# Patient Record
Sex: Female | Born: 1950 | Race: White | Hispanic: No | Marital: Single | Smoking: Current every day smoker
Health system: Southern US, Community
[De-identification: ages and names within clinical notes are randomized; demographics above are authoritative.]

## PROBLEM LIST (undated history)

## (undated) DIAGNOSIS — M5136 Other intervertebral disc degeneration, lumbar region: Secondary | ICD-10-CM

## (undated) DIAGNOSIS — M1990 Unspecified osteoarthritis, unspecified site: Secondary | ICD-10-CM

## (undated) DIAGNOSIS — O2230 Deep phlebothrombosis in pregnancy, unspecified trimester: Secondary | ICD-10-CM

## (undated) DIAGNOSIS — F319 Bipolar disorder, unspecified: Secondary | ICD-10-CM

## (undated) DIAGNOSIS — M51369 Other intervertebral disc degeneration, lumbar region without mention of lumbar back pain or lower extremity pain: Secondary | ICD-10-CM

## (undated) DIAGNOSIS — I2699 Other pulmonary embolism without acute cor pulmonale: Secondary | ICD-10-CM

## (undated) DIAGNOSIS — E119 Type 2 diabetes mellitus without complications: Secondary | ICD-10-CM

## (undated) HISTORY — PX: CERVICAL DISCECTOMY: SHX98

## (undated) HISTORY — PX: JOINT REPLACEMENT: SHX530

---

## 2018-01-24 ENCOUNTER — Emergency Department (HOSPITAL_COMMUNITY): Payer: Medicare (Managed Care)

## 2018-01-24 ENCOUNTER — Emergency Department (HOSPITAL_COMMUNITY)
Admission: EM | Admit: 2018-01-24 | Discharge: 2018-01-24 | Disposition: A | Discharge status: Home / Self Care | Payer: Medicare (Managed Care) | Attending: Physician Assistant | Admitting: Physician Assistant

## 2018-01-24 ENCOUNTER — Encounter (HOSPITAL_COMMUNITY): Payer: Self-pay | Admitting: Emergency Medicine

## 2018-01-24 ENCOUNTER — Other Ambulatory Visit: Payer: Self-pay

## 2018-01-24 DIAGNOSIS — R0902 Hypoxemia: Secondary | ICD-10-CM | POA: Insufficient documentation

## 2018-01-24 DIAGNOSIS — E119 Type 2 diabetes mellitus without complications: Secondary | ICD-10-CM | POA: Insufficient documentation

## 2018-01-24 DIAGNOSIS — Y999 Unspecified external cause status: Secondary | ICD-10-CM | POA: Insufficient documentation

## 2018-01-24 DIAGNOSIS — S73015A Posterior dislocation of left hip, initial encounter: Secondary | ICD-10-CM | POA: Insufficient documentation

## 2018-01-24 DIAGNOSIS — Y9301 Activity, walking, marching and hiking: Secondary | ICD-10-CM | POA: Insufficient documentation

## 2018-01-24 DIAGNOSIS — R52 Pain, unspecified: Secondary | ICD-10-CM

## 2018-01-24 DIAGNOSIS — W010XXA Fall on same level from slipping, tripping and stumbling without subsequent striking against object, initial encounter: Secondary | ICD-10-CM | POA: Insufficient documentation

## 2018-01-24 DIAGNOSIS — Z9889 Other specified postprocedural states: Secondary | ICD-10-CM

## 2018-01-24 DIAGNOSIS — Y9289 Other specified places as the place of occurrence of the external cause: Secondary | ICD-10-CM | POA: Insufficient documentation

## 2018-01-24 DIAGNOSIS — Z885 Allergy status to narcotic agent status: Secondary | ICD-10-CM | POA: Insufficient documentation

## 2018-01-24 DIAGNOSIS — Z96642 Presence of left artificial hip joint: Secondary | ICD-10-CM | POA: Insufficient documentation

## 2018-01-24 DIAGNOSIS — Z880 Allergy status to penicillin: Secondary | ICD-10-CM | POA: Insufficient documentation

## 2018-01-24 DIAGNOSIS — F17200 Nicotine dependence, unspecified, uncomplicated: Secondary | ICD-10-CM | POA: Insufficient documentation

## 2018-01-24 DIAGNOSIS — IMO0001 Reserved for inherently not codable concepts without codable children: Secondary | ICD-10-CM

## 2018-01-24 DIAGNOSIS — Z86711 Personal history of pulmonary embolism: Secondary | ICD-10-CM | POA: Insufficient documentation

## 2018-01-24 HISTORY — DX: Other intervertebral disc degeneration, lumbar region: M51.36

## 2018-01-24 HISTORY — DX: Other intervertebral disc degeneration, lumbar region without mention of lumbar back pain or lower extremity pain: M51.369

## 2018-01-24 HISTORY — DX: Deep phlebothrombosis in pregnancy, unspecified trimester: O22.30

## 2018-01-24 HISTORY — DX: Bipolar disorder, unspecified: F31.9

## 2018-01-24 HISTORY — DX: Other pulmonary embolism without acute cor pulmonale: I26.99

## 2018-01-24 HISTORY — DX: Type 2 diabetes mellitus without complications: E11.9

## 2018-01-24 HISTORY — DX: Unspecified osteoarthritis, unspecified site: M19.90

## 2018-01-24 MED ORDER — PROPOFOL 10 MG/ML IV BOLUS
1.0000 mg/kg | Freq: Once | INTRAVENOUS | Status: DC
  Filled 2018-01-24: qty 20

## 2018-01-24 MED ORDER — SODIUM CHLORIDE 0.9 % IV BOLUS (SEPSIS)
500.0000 mL | Freq: Once | INTRAVENOUS | Status: AC
  Administered 2018-01-24: 500 mL via INTRAVENOUS

## 2018-01-24 MED ORDER — HYDROMORPHONE HCL 1 MG/ML IJ SOLN
2.0000 mg | Freq: Once | INTRAMUSCULAR | Status: AC
  Administered 2018-01-24: 2 mg via INTRAVENOUS
  Filled 2018-01-24: qty 2

## 2018-01-24 MED ORDER — PROPOFOL 10 MG/ML IV BOLUS
INTRAVENOUS | Status: AC | PRN
  Administered 2018-01-24: 5 mg via INTRAVENOUS

## 2018-01-24 MED ORDER — KETAMINE HCL 10 MG/ML IJ SOLN
INTRAMUSCULAR | Status: AC | PRN
  Administered 2018-01-24: 7 mg via INTRAVENOUS

## 2018-01-24 MED ORDER — KETAMINE HCL-SODIUM CHLORIDE 100-0.9 MG/10ML-% IV SOSY
1.0000 mg/kg | PREFILLED_SYRINGE | Freq: Once | INTRAVENOUS | Status: DC
  Filled 2018-01-24: qty 10

## 2018-01-24 NOTE — ED Notes (Signed)
Pt ambulated to bathroom and back to room well. Pt stated that she has no pain at all.

## 2018-01-24 NOTE — Discharge Planning (Signed)
Erika Cohnamellia [name], RN, BSN, [place] [phone] Pt qualifies for DME rolling walker.  DME  ordered through [facility].  [facility]er to pt room prior to D/C home.

## 2018-01-24 NOTE — ED Provider Notes (Signed)
[facility] EMERGENCY DEPARTMENT Provider Note   CSN: [id number] Arrival date & time: 01/24/18  0800     History   Chief Complaint Chief Complaint  Patient presents with  . Fall  . Hip Pain    HPI Erika Anthony is a 67 y.o. female who presents with left hip pain.  Past medical history significant for arthritis, degenerative disc disease, chronic low back pain.  Past surgical history significant for left total hip replacement and left total knee replacement.  She states she is from [place] and has been visiting a friend for the past 3 weeks.  Today she was at the airport to go back home when she tripped and fell onto the right side.  She states she has had falls in the past however usually can get up on her own.  She was not able to get up and therefore EMS was called and she was transported to the ED.  She denies worsening of her chronic low back pain or any knee pain. She has had incidents in the past were she has felt her hip dislocate but it has always gone back in to place.  HPI  Past Medical History:  Diagnosis Date  . Arthritis    Osteoarthritis  . Bipolar 1 disorder (HCC)   . Degenerative disc disease, lumbar   . Diabetes mellitus without complication (HCC)   . DVT (deep vein thrombosis) in pregnancy (HCC)   . PE (pulmonary thromboembolism) (HCC)     There are no active problems to display for this patient.   Past Surgical History:  Procedure Laterality Date  . CERVICAL DISCECTOMY    . JOINT REPLACEMENT     L Hip - December 2018    OB History    No data available       Home Medications    Prior to Admission medications   Not on File    Family History No family history on file.  Social History Social History   Tobacco Use  . Smoking status: Current Every Day Smoker    Packs/day: 0.50  . Smokeless tobacco: Never Used  Substance Use Topics  . Alcohol use: No    Frequency: Never  . Drug use: No     Allergies   Morphine  and related and Penicillins   Review of Systems Review of Systems  Respiratory: Negative for shortness of breath.   Cardiovascular: Negative for chest pain.  Musculoskeletal: Positive for arthralgias, gait problem and myalgias. Negative for back pain.  Skin: Positive for wound.  Neurological: Negative for weakness and numbness.     Physical Exam Updated Vital Signs BP 138/81 (BP Location: Right Arm)   Pulse 89   Temp 98.4 F (36.9 C)   Resp 16   Ht 5\' 9"  (1.753 m)   Wt 106.6 kg (235 lb)   SpO2 99%   BMI 34.70 kg/m   Physical Exam  Constitutional: She is oriented to person, place, and time. She appears well-developed and well-nourished. No distress.  Pleasant. Lying on her right side  HENT:  Head: Normocephalic and atraumatic.  Eyes: Conjunctivae are normal. Pupils are equal, round, and reactive to light. Right eye exhibits no discharge. Left eye exhibits no discharge. No scleral icterus.  Neck: Normal range of motion.  Cardiovascular: Normal rate.  Pulmonary/Chest: Effort normal. No respiratory distress.  Abdominal: She exhibits no distension.  Musculoskeletal:  Mild lumbar tenderness  Left hip: Prior surgical scar. Tenderness over lateral and anterior hip.  Left knee: No obvious swelling, deformity, or warmth. Prior surgical scar. Small skin tear. No tenderness. N/V intact.   Neurological: She is alert and oriented to person, place, and time.  Skin: Skin is warm and dry.  Psychiatric: She has a normal mood and affect. Her behavior is normal.  Nursing note and vitals reviewed.    ED Treatments / Results  Labs (all labs ordered are listed, but only abnormal results are displayed) Labs Reviewed - No data to display  EKG  EKG Interpretation None       Radiology Dg Lumbar Spine 2-3 Views  Result Date: 01/24/2018 CLINICAL DATA:  Low back pain after fall today. EXAM: LUMBAR SPINE - 2-3 VIEW COMPARISON:  None. FINDINGS: No fracture or spondylolisthesis is  noted. Atherosclerosis of thoracic aorta is noted. Moderate degenerative disc disease is noted at L1-2, L2-3, L3-4 and L4-5. IVC filter is noted. IMPRESSION: Multilevel degenerative disc disease. No acute abnormality seen in the lumbar spine. Electronically Signed   By: Lupita Raider, M.D.   On: 01/24/2018 10:51   Dg Knee Complete 4 Views Left  Result Date: 01/24/2018 CLINICAL DATA:  Left knee pain after fall today. EXAM: LEFT KNEE - COMPLETE 4+ VIEW COMPARISON:  None. FINDINGS: Status post left total knee arthroplasty. The femoral and tibial components appear to be well situated. No fracture or dislocation is noted. No soft tissue abnormality is noted. IMPRESSION: Status post left total knee arthroplasty. No acute abnormality seen the left knee. Electronically Signed   By: Lupita Raider, M.D.   On: 01/24/2018 10:56   Dg Hip Unilat With Pelvis 2-3 Views Left  Result Date: 01/24/2018 CLINICAL DATA:  Hip dislocation, postreduction EXAM: DG HIP (WITH OR WITHOUT PELVIS) 2-3V LEFT COMPARISON:  None. FINDINGS: There has been reduction of the posterosuperior dislocation of the LEFT femoral prosthesis in this patient with a LEFT total hip arthroplasty. No fractures are seen. There is no apparent loosening. IMPRESSION: Satisfactory post reduction radiographs. Electronically Signed   By: Elsie Stain M.D.   On: 01/24/2018 14:15   Dg Hip Unilat W Or Wo Pelvis 2-3 Views Left  Result Date: 01/24/2018 CLINICAL DATA:  Left hip pain after fall today. EXAM: DG HIP (WITH OR WITHOUT PELVIS) 2-3V LEFT COMPARISON:  None. FINDINGS: Status post left total hip arthroplasty. There appears to be posterior and superior dislocation of the left femoral prosthesis relative to the acetabulum. No definite fracture is noted. IMPRESSION: Dislocation of left hip prosthesis is noted. Electronically Signed   By: Lupita Raider, M.D.   On: 01/24/2018 10:53    Procedures Reduction of dislocation Date/Time: 01/24/2018 3:02  PM Performed by: Bethel Born, PA-C Authorized by: Bethel Born, PA-C  Consent: Verbal consent obtained. Written consent obtained. Risks and benefits: risks, benefits and alternatives were discussed Consent given by: patient Patient understanding: patient states understanding of the procedure being performed Patient consent: the patient's understanding of the procedure matches consent given Procedure consent: procedure consent matches procedure scheduled Relevant documents: relevant documents present and verified Test results: test results available and properly labeled Site marked: the operative site was marked Imaging studies: imaging studies available Required items: required blood products, implants, devices, and special equipment available Patient identity confirmed: verbally with patient and arm band Time out: Immediately prior to procedure a "time out" was called to verify the correct patient, procedure, equipment, support staff and site/side marked as required. Preparation: Patient was prepped and draped in the usual sterile fashion. Local  anesthesia used: no  Anesthesia: Local anesthesia used: no  Sedation: Patient sedated: yes Sedation type: moderate (conscious) sedation Sedatives: ketamine and propofol Analgesia: hydromorphone Sedation start date/time: 01/24/2018 12:30 PM Sedation end date/time: 01/24/2018 3:03 PM Vitals: Vital signs were monitored during sedation.  Patient tolerance of procedure: Patient had laryngospasm and had desaturations. She was provided O2 through bag valve mask and resumed respirations on her own.    (including critical care time)    Medications Ordered in ED Medications  HYDROmorphone (DILAUDID) injection 2 mg (2 mg Intravenous Given 01/24/18 1035)     Initial Impression / Assessment and Plan / ED Course  I have reviewed the triage vital signs and the nursing notes.  Pertinent labs & imaging results that were available  during my care of the patient were reviewed by me and considered in my medical decision making (see chart for details).  67 year old with left hip pain. Vitals are normal. Xrays and pain medicine were ordered. She is asking for 4mg  of Dilaudid. Narcotic database [place] quer[place] in [place] which shows only rx for Gabapentin for the past year. 2mg  was ordered.  11:03 AM Xrays of left hip show a posterior and superior dislocation. Xray of lumbar spine and knee are negative.   12:30 PM Patient was consciously sedated and hip was successfully reduced.  2:15 PM: Post-reduction films confirms successful reduction. She was able to ambulate with a walker. She was given post-reduction precautions and was advised to follow up with orthopedics.  Final Clinical Impressions(s) / ED Diagnoses   Final diagnoses:  Posterior dislocation of left hip, initial encounter [facility](HCC)    ED Discharge Orders    None       Bethel BornGekas, Daniela Siebers Marie, PA-C 01/24/18 1520    Mackuen, Cindee Saltourteney Lyn, MD 01/24/18 1538

## 2018-01-24 NOTE — Progress Notes (Signed)
During conscious sedation procedure pt stopped breathing. RT provided manual ventilation using ambu bag until pt recovered. Vitals recorded are post procedure.

## 2018-01-24 NOTE — ED Triage Notes (Signed)
Pt in after tripping and falling in airport. C/o L hip pain, no obvious deformity. Pain with extension of leg. Hx of L hip replacement in December. Skin tear present on L knee. Good pedal pulse

## 2018-01-24 NOTE — Discharge Instructions (Signed)
Please wear knee immobilizer at all times until you are cleared by your doctor Use walker To prevent another dislocation: Sit with your back straight and your feet flat on the floor. Do not cross your legs. Do not lean forward when you sit in a chair. Keep your knees apart. Place a pillow or wedge between your knees when you sit or lie down. Do not twist your knees. Do not lift your knees higher than your hips. Do not sit in a low chair. Use armrests and your upper body strength to push yourself up from a sitting position. Do not bend at the waist to pick up an object from the floor. Bend your knees to reach the object, or use a tool to pick it up.

## 2018-01-24 NOTE — Progress Notes (Signed)
pts vitals recorded prior to conscious sedation procedure.

## 2018-01-24 NOTE — Progress Notes (Signed)
Orthopedic Tech Progress Note Patient Details:  Erika Anthony 06/28/1951 [id number]  Ortho Devices Type of Ortho Device: Knee Immobilizer Ortho Device/Splint Location: lle Ortho Device/Splint Interventions: Application   Post Interventions Patient Tolerated: Well Instructions Provided: Care of device   Nikki DomCrawford, [name] 01/24/2018, 1:19 PM

## 2018-11-18 IMAGING — CR DG HIP (WITH OR WITHOUT PELVIS) 2-3V*L*
3 series · 3 of 3 positions shown · non-contrast
Comparison: None.

CLINICAL DATA: Left hip pain after fall today.

EXAM:
DG HIP (WITH OR WITHOUT PELVIS) 2-3V LEFT

[pelvis ap]
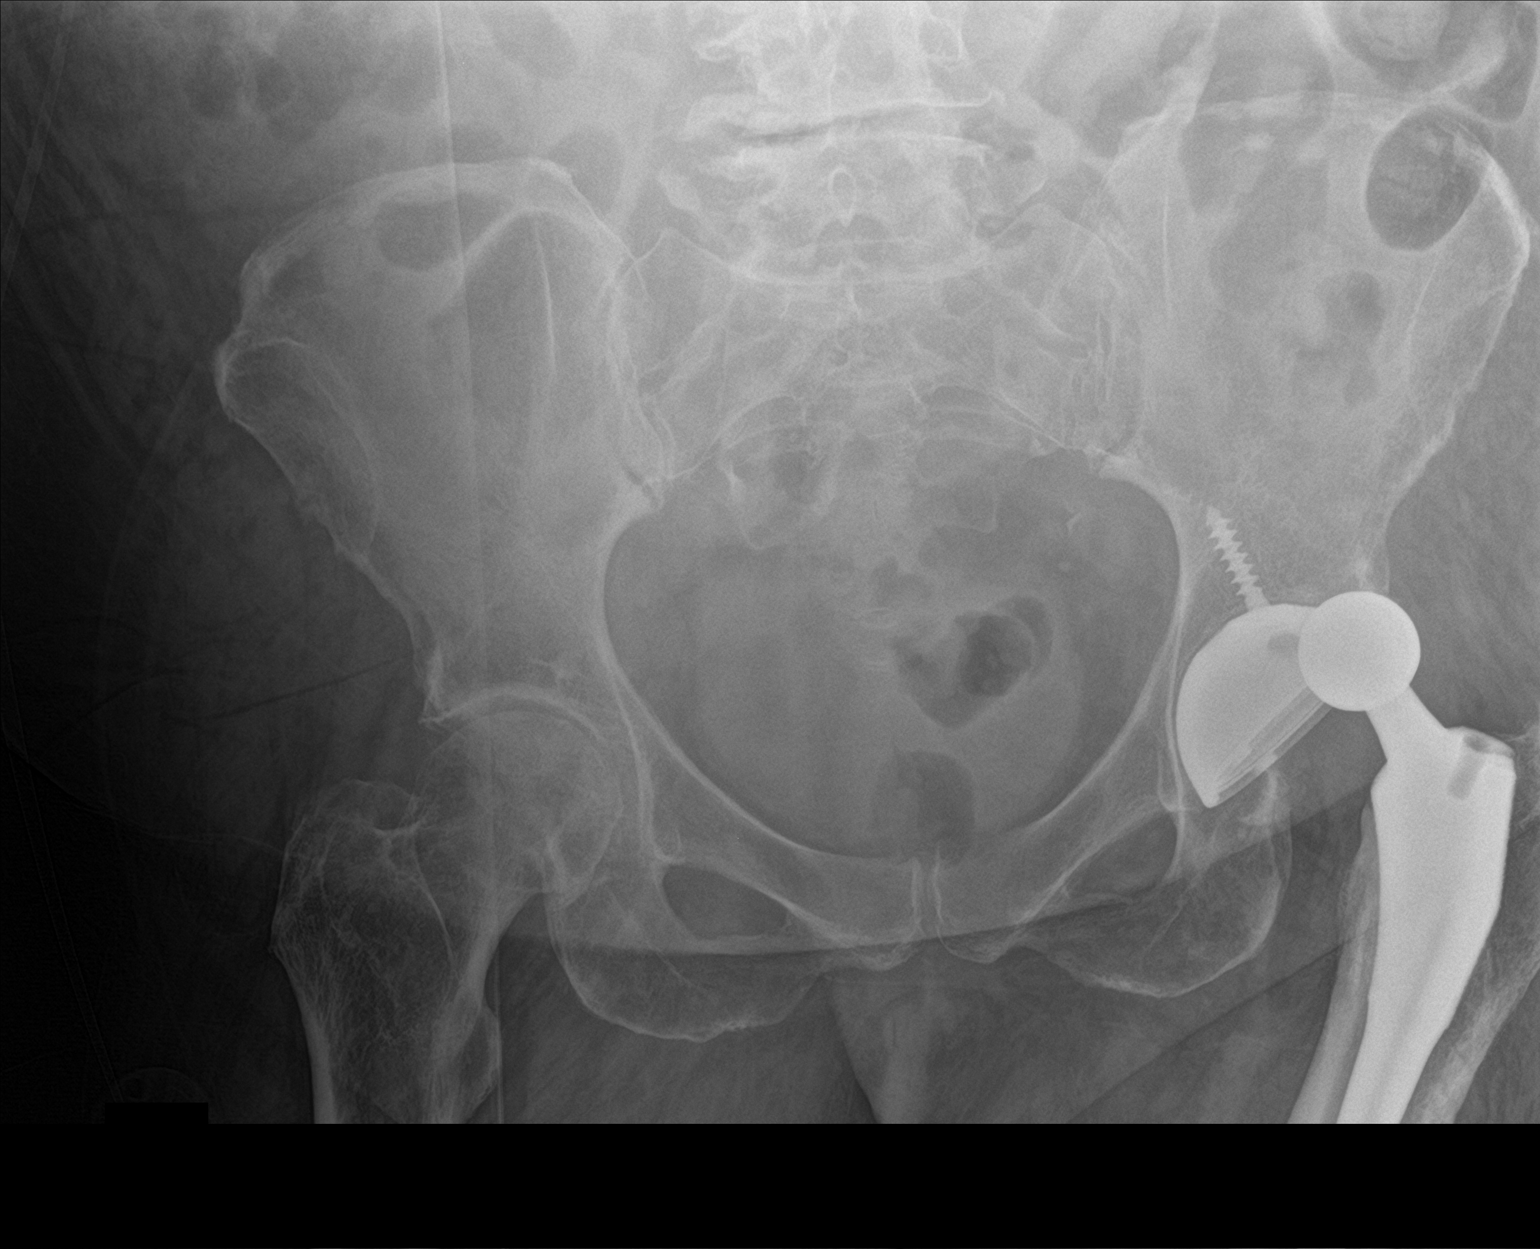

[hip ap]
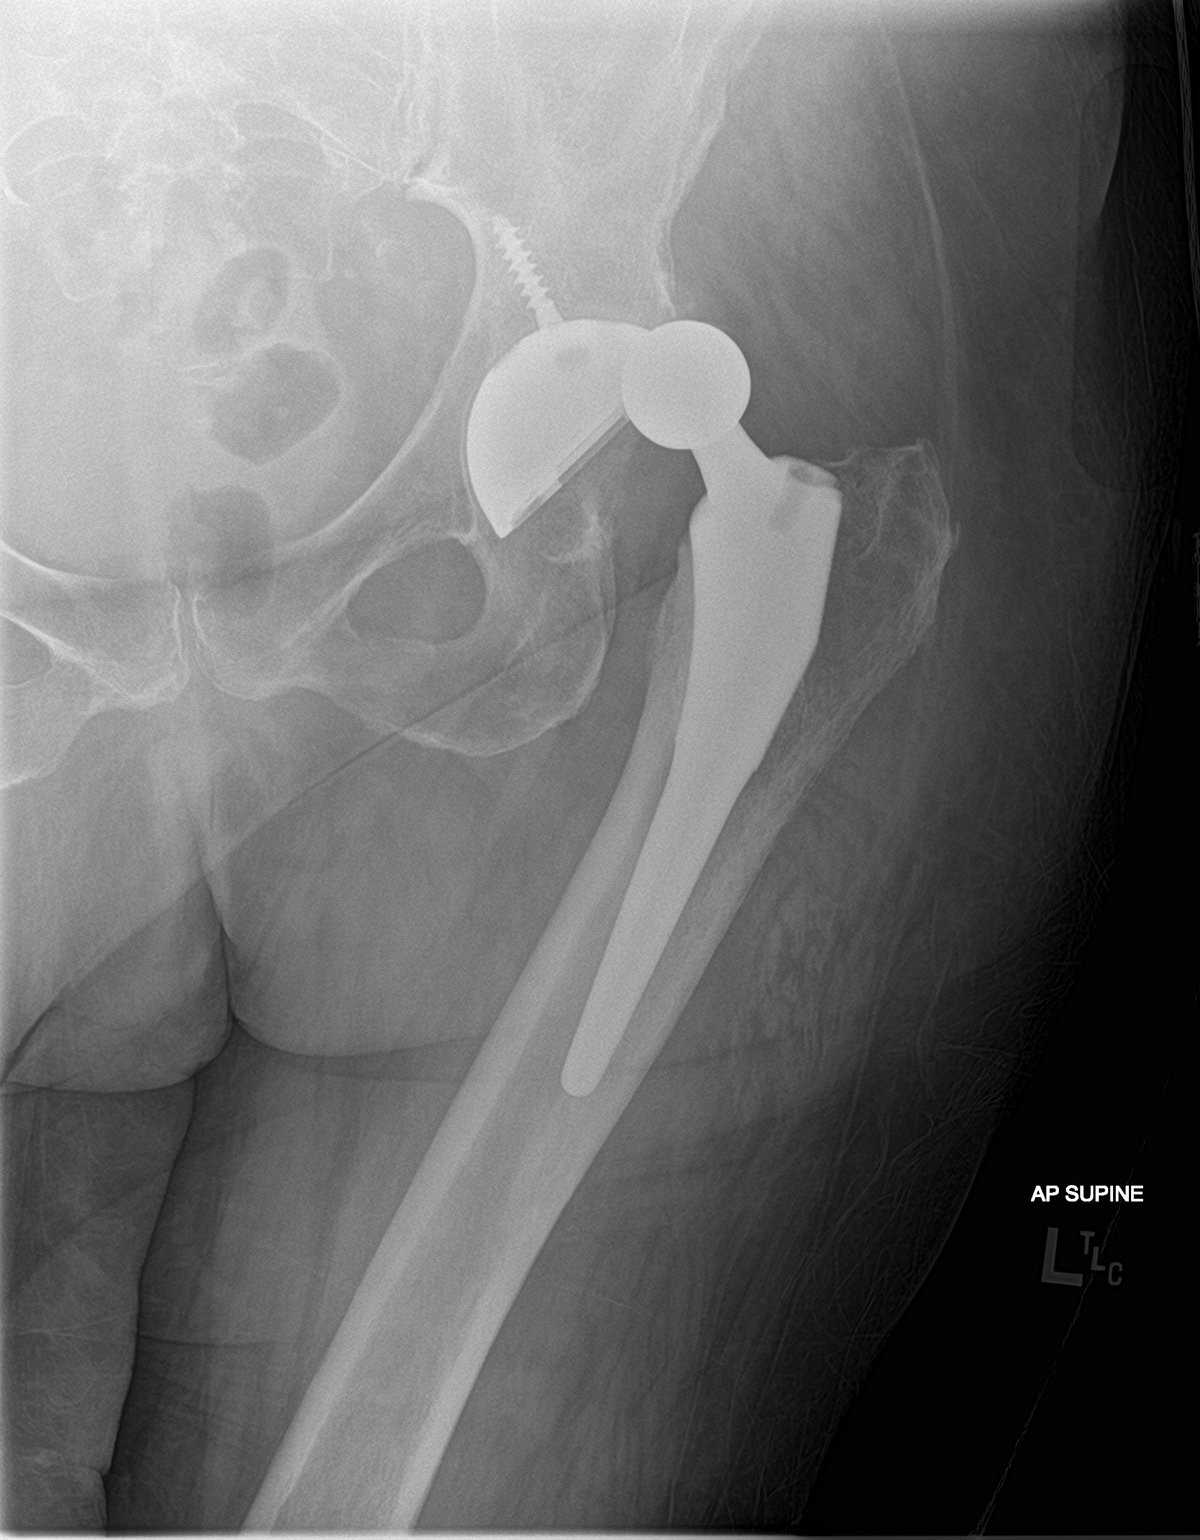

[hip x-table]
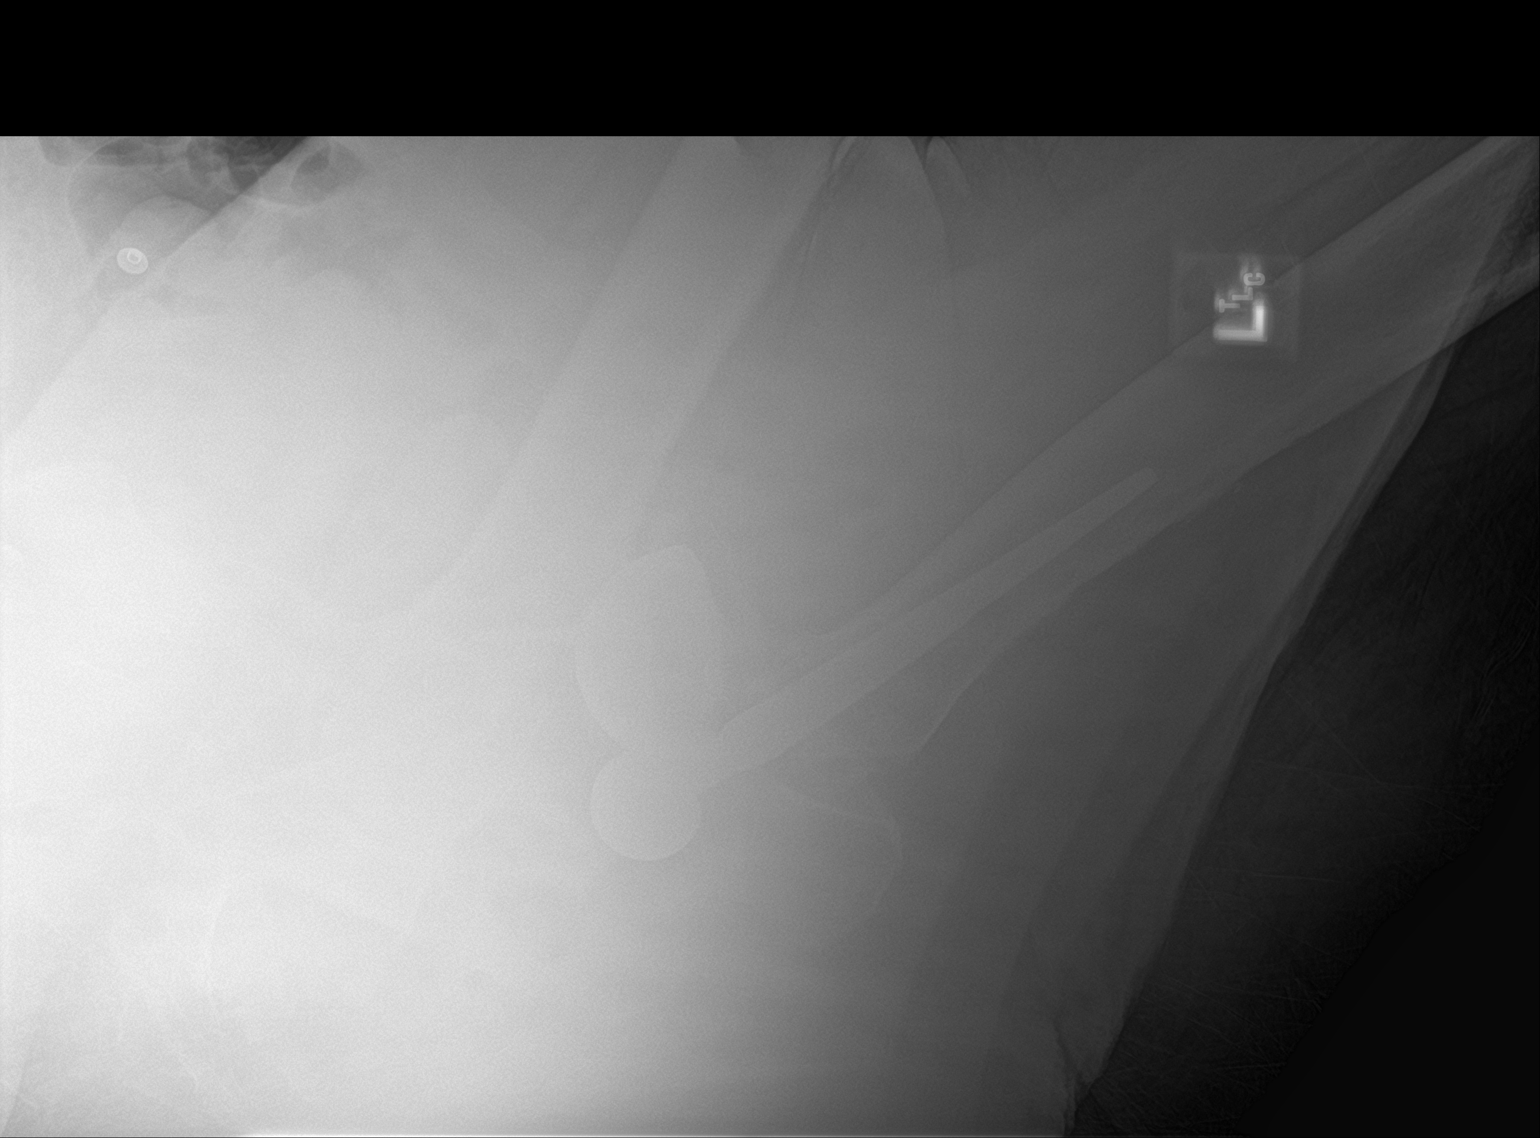

[3 of 3 positions shown; findings below may reference images not displayed]

FINDINGS: Status post left total hip arthroplasty. There appears to be
posterior and superior dislocation of the left femoral prosthesis
relative to the acetabulum. No definite fracture is noted.
IMPRESSION: Dislocation of left hip prosthesis is noted.
